# Patient Record
Sex: Male | Born: 1970 | Race: Black or African American | Hispanic: No | Marital: Single | Smoking: Current every day smoker
Health system: Southern US, Community
[De-identification: ages and names within clinical notes are randomized; demographics above are authoritative.]

## PROBLEM LIST (undated history)

## (undated) DIAGNOSIS — I10 Essential (primary) hypertension: Secondary | ICD-10-CM

## (undated) HISTORY — PX: ORTHOPEDIC SURGERY: SHX850

---

## 2001-01-12 ENCOUNTER — Emergency Department (HOSPITAL_COMMUNITY): Admission: EM | Admit: 2001-01-12 | Discharge: 2001-01-12 | Payer: Self-pay | Admitting: *Deleted

## 2001-01-12 ENCOUNTER — Encounter: Payer: Self-pay | Admitting: *Deleted

## 2001-01-19 ENCOUNTER — Encounter: Payer: Self-pay | Admitting: Orthopedic Surgery

## 2001-01-19 ENCOUNTER — Ambulatory Visit (HOSPITAL_COMMUNITY): Admission: RE | Admit: 2001-01-19 | Discharge: 2001-01-19 | Payer: Self-pay | Admitting: Orthopedic Surgery

## 2002-01-31 ENCOUNTER — Inpatient Hospital Stay (HOSPITAL_COMMUNITY): Admission: EM | Admit: 2002-01-31 | Discharge: 2002-02-03 | Payer: Self-pay | Admitting: Emergency Medicine

## 2002-01-31 ENCOUNTER — Encounter: Payer: Self-pay | Admitting: Emergency Medicine

## 2012-02-18 ENCOUNTER — Other Ambulatory Visit (HOSPITAL_COMMUNITY): Payer: Self-pay | Admitting: Internal Medicine

## 2012-02-18 ENCOUNTER — Ambulatory Visit (HOSPITAL_COMMUNITY)
Admission: RE | Admit: 2012-02-18 | Discharge: 2012-02-18 | Disposition: A | Discharge status: Home / Self Care | Payer: Self-pay | Source: Ambulatory Visit | Attending: Internal Medicine | Admitting: Internal Medicine

## 2012-02-18 DIAGNOSIS — M25461 Effusion, right knee: Secondary | ICD-10-CM

## 2012-02-18 DIAGNOSIS — M25469 Effusion, unspecified knee: Secondary | ICD-10-CM | POA: Insufficient documentation

## 2013-07-03 IMAGING — CR DG KNEE COMPLETE 4+V*R*
4 series · 4 of 4 positions shown · non-contrast
Comparison: None.

CLINICAL DATA: Right knee swelling.

RIGHT KNEE - COMPLETE 4+ VIEW

[view not recorded (1 of 4)]
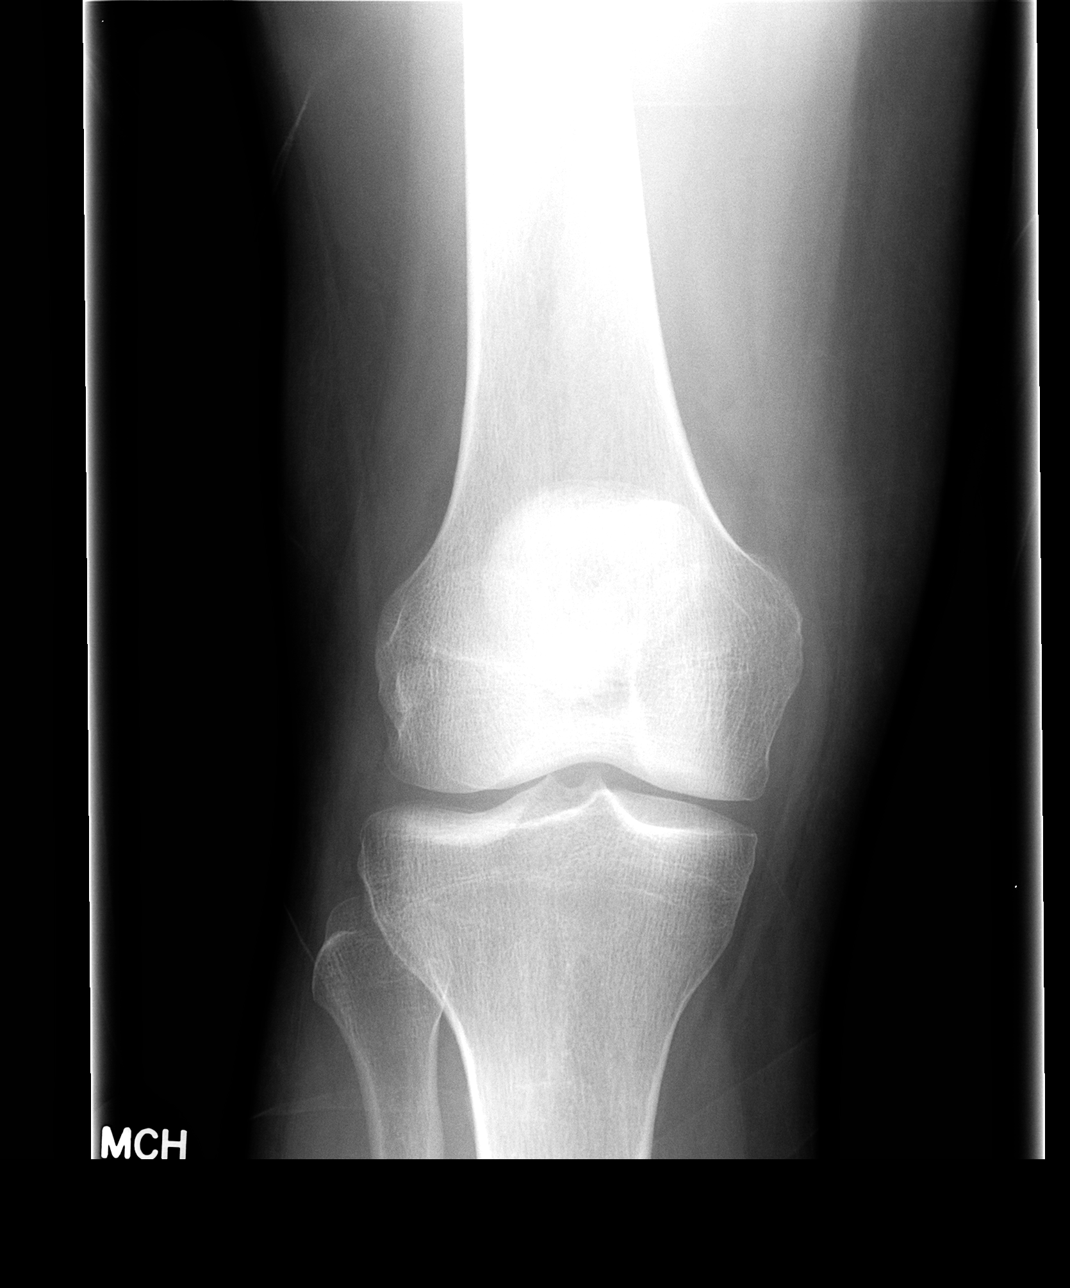

[view not recorded (2 of 4)]
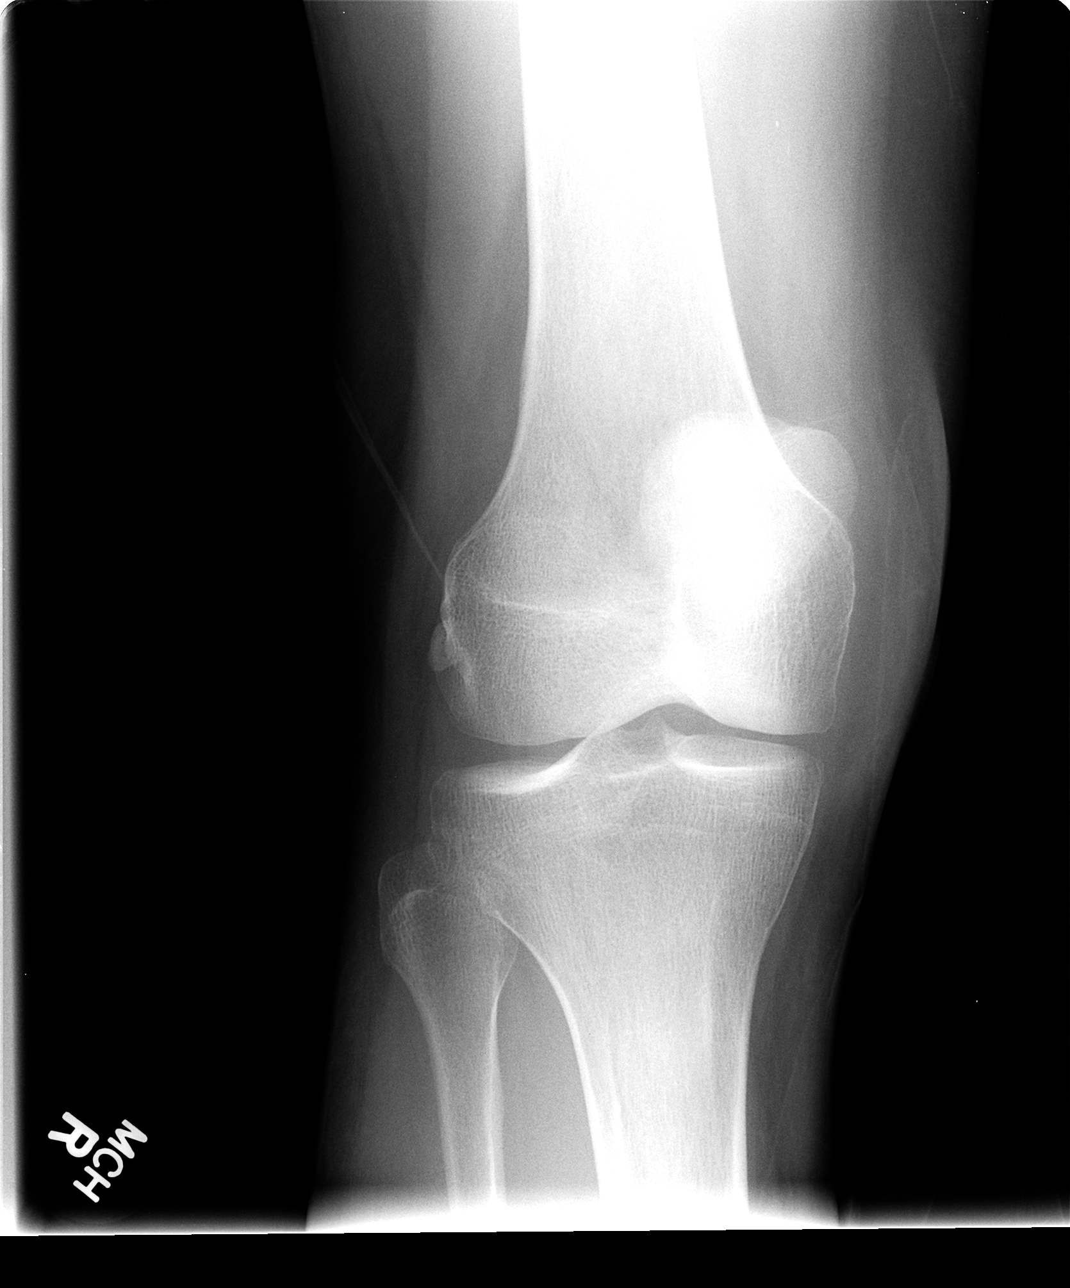

[view not recorded (3 of 4)]
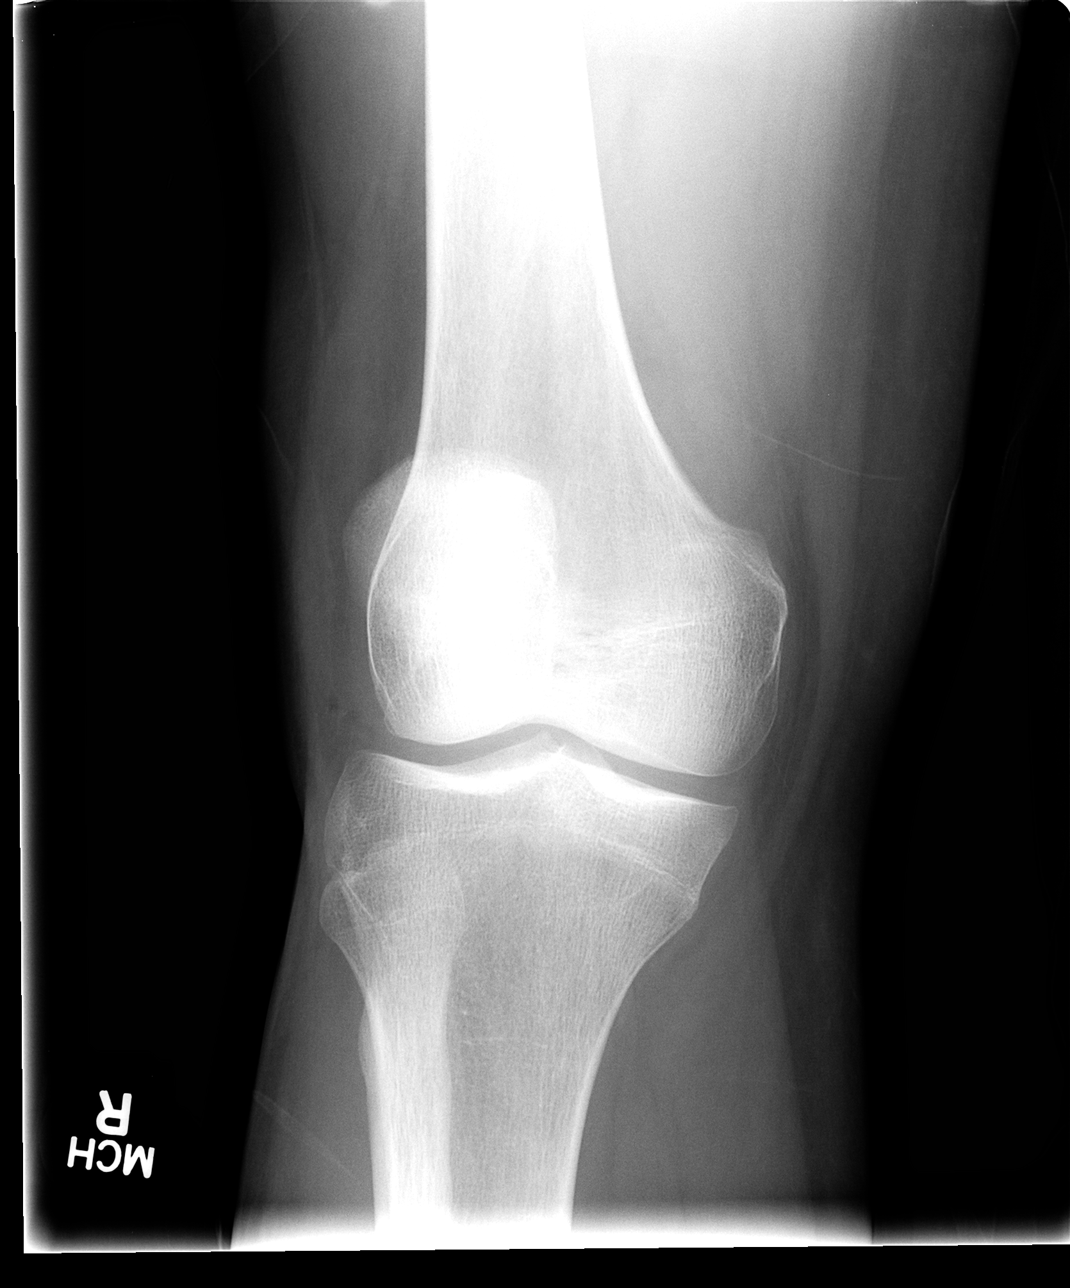

[view not recorded (4 of 4)]
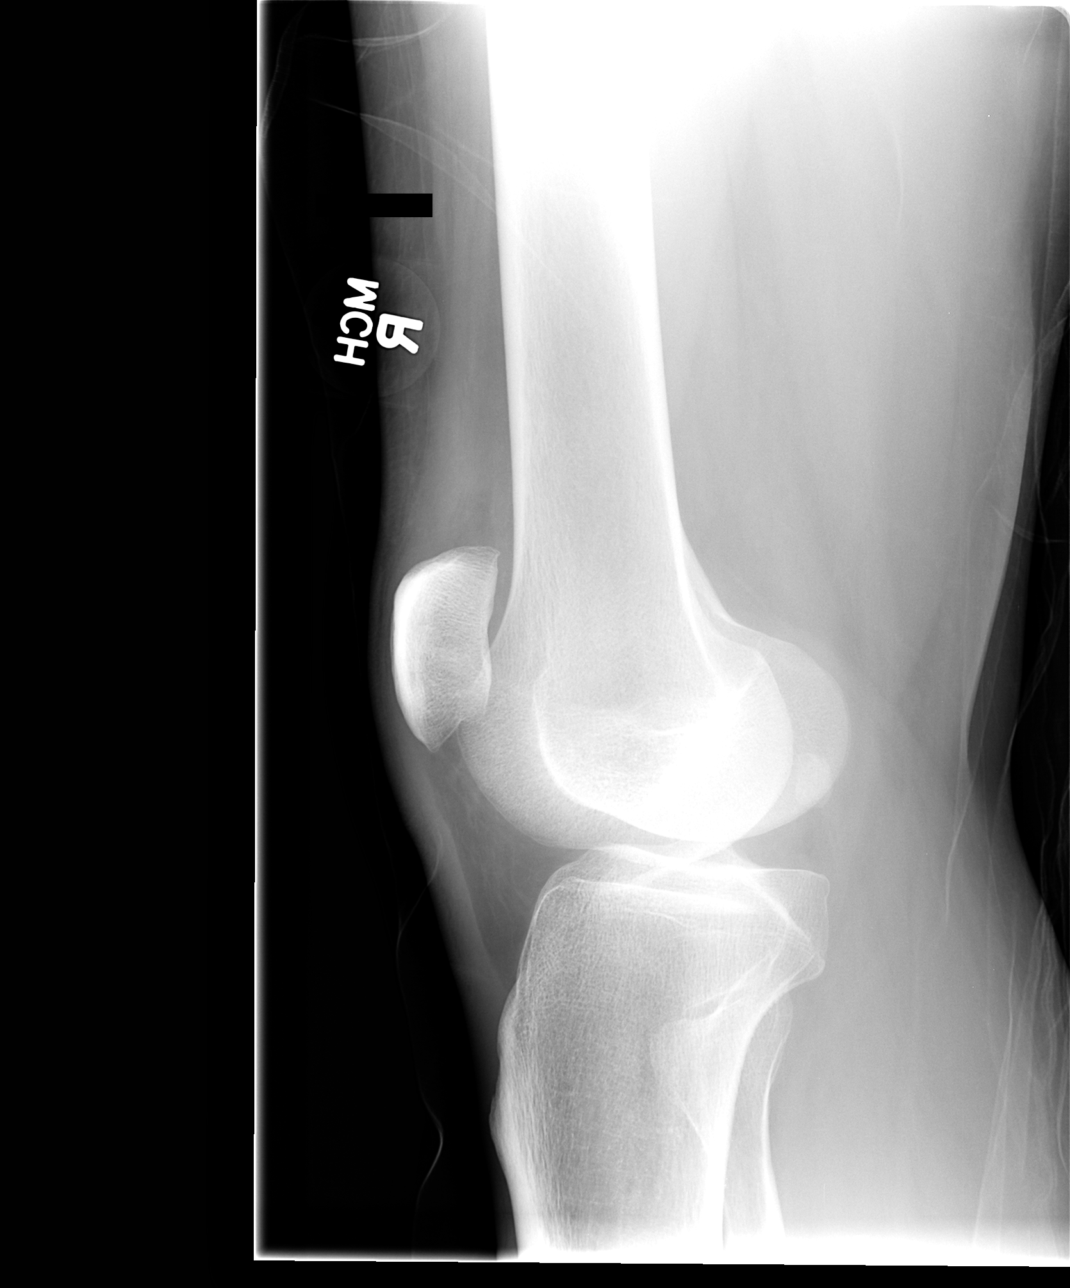

[4 of 4 positions shown; findings below may reference images not displayed]

FINDINGS: Imaged bones, joints and soft tissues appear normal.
IMPRESSION: Negative exam.

## 2022-10-21 ENCOUNTER — Emergency Department (HOSPITAL_COMMUNITY)
Admission: EM | Admit: 2022-10-21 | Discharge: 2022-10-21 | Disposition: A | Discharge status: Home / Self Care | Payer: Commercial Managed Care - HMO | Attending: Emergency Medicine | Admitting: Emergency Medicine

## 2022-10-21 ENCOUNTER — Other Ambulatory Visit: Payer: Self-pay

## 2022-10-21 ENCOUNTER — Emergency Department (HOSPITAL_COMMUNITY): Payer: Commercial Managed Care - HMO

## 2022-10-21 ENCOUNTER — Encounter (HOSPITAL_COMMUNITY): Payer: Self-pay | Admitting: Emergency Medicine

## 2022-10-21 DIAGNOSIS — L539 Erythematous condition, unspecified: Secondary | ICD-10-CM | POA: Diagnosis not present

## 2022-10-21 DIAGNOSIS — R6883 Chills (without fever): Secondary | ICD-10-CM | POA: Insufficient documentation

## 2022-10-21 DIAGNOSIS — D72829 Elevated white blood cell count, unspecified: Secondary | ICD-10-CM | POA: Insufficient documentation

## 2022-10-21 DIAGNOSIS — M79662 Pain in left lower leg: Secondary | ICD-10-CM | POA: Diagnosis present

## 2022-10-21 DIAGNOSIS — L03116 Cellulitis of left lower limb: Secondary | ICD-10-CM | POA: Diagnosis not present

## 2022-10-21 HISTORY — DX: Essential (primary) hypertension: I10

## 2022-10-21 LAB — COMPREHENSIVE METABOLIC PANEL
ALT: 14 U/L (ref 0–44)
AST: 28 U/L (ref 15–41)
Albumin: 3.9 g/dL (ref 3.5–5.0)
Alkaline Phosphatase: 75 U/L (ref 38–126)
Anion gap: 10 (ref 5–15)
BUN: 15 mg/dL (ref 6–20)
CO2: 25 mmol/L (ref 22–32)
Calcium: 8.7 mg/dL — ABNORMAL LOW (ref 8.9–10.3)
Chloride: 102 mmol/L (ref 98–111)
Creatinine, Ser: 1.07 mg/dL (ref 0.61–1.24)
GFR, Estimated: >60 mL/min (ref >60)
Glucose, Bld: 98 mg/dL (ref 70–99)
Potassium: 3.9 mmol/L (ref 3.5–5.1)
Sodium: 137 mmol/L (ref 135–145)
Total Bilirubin: 0.6 mg/dL (ref 0.3–1.2)
Total Protein: 7.5 g/dL (ref 6.5–8.1)

## 2022-10-21 LAB — CBC WITH DIFFERENTIAL/PLATELET
Abs Immature Granulocytes: 0.11 10*3/uL — ABNORMAL HIGH (ref 0.00–0.07)
Basophils Absolute: 0 10*3/uL (ref 0.0–0.1)
Basophils Relative: 0 %
Eosinophils Absolute: 0.1 10*3/uL (ref 0.0–0.5)
Eosinophils Relative: 0 %
HCT: 43.2 % (ref 39.0–52.0)
Hemoglobin: 13.7 g/dL (ref 13.0–17.0)
Immature Granulocytes: 1 %
Lymphocytes Relative: 9 %
Lymphs Abs: 2 10*3/uL (ref 0.7–4.0)
MCH: 26.9 pg (ref 26.0–34.0)
MCHC: 31.7 g/dL (ref 30.0–36.0)
MCV: 84.7 fL (ref 80.0–100.0)
Monocytes Absolute: 1.1 10*3/uL — ABNORMAL HIGH (ref 0.1–1.0)
Monocytes Relative: 5 %
Neutro Abs: 18.9 10*3/uL — ABNORMAL HIGH (ref 1.7–7.7)
Neutrophils Relative %: 85 %
Platelets: 223 10*3/uL (ref 150–400)
RBC: 5.1 MIL/uL (ref 4.22–5.81)
RDW: 13.2 % (ref 11.5–15.5)
WBC: 22.2 10*3/uL — ABNORMAL HIGH (ref 4.0–10.5)
nRBC: 0 % (ref 0.0–0.2)

## 2022-10-21 MED ORDER — IBUPROFEN 600 MG PO TABS: 600.0000 mg | ORAL_TABLET | Freq: Four times a day (QID) | ORAL | 0 refills | Status: DC | PRN

## 2022-10-21 MED ORDER — OXYCODONE-ACETAMINOPHEN 5-325 MG PO TABS
1.0000 | ORAL_TABLET | Freq: Once | ORAL | Status: AC
  Administered 2022-10-21: 1 via ORAL
  Filled 2022-10-21: qty 1

## 2022-10-21 MED ORDER — DEXTROSE 5 % IV SOLN
1500.0000 mg | Freq: Once | INTRAVENOUS | Status: AC
  Administered 2022-10-21: 1500 mg via INTRAVENOUS
  Filled 2022-10-21: qty 75

## 2022-10-21 NOTE — ED Provider Notes (Signed)
[facility] EMERGENCY DEPARTMENT Provider Note   CSN: [id number] Arrival date & time: 10/21/22  1357     History  Chief Complaint  Patient presents with   Leg Pain    Jeffery Tran is a 52 y.o. male.   Leg Pain Associated symptoms: no fever        Jeffery Tran is a 52 y.o. male with past medical history of type tension who presents to the Emergency Department complaining of gradual onset of left lower leg pain, redness, and swelling.  Symptoms began 2 days ago.  Initially, he noticed some nausea and couple episodes of vomiting.  He also endorses having some loose stools over the last 2 days.  Nonbloody or black in color.  He also noticed some circumferential redness to the left lower leg and pain that is constant but worse with weightbearing.  Feels that his leg is swollen and has a burning sensation.  He denies any itching, some chills at home but unsure of fever.  No chest pain, cough, shortness of breath or numbness of the extremity  Denies known injury or possible insect bites of the lower leg.  No history of DVTs.  Home Medications Prior to Admission medications   Not on File      Allergies    Patient has no known allergies.    Review of Systems   Review of Systems  Constitutional:  Negative for chills and fever.  Respiratory:  Negative for cough and shortness of breath.   Cardiovascular:  Negative for chest pain.  Gastrointestinal:  Positive for diarrhea, nausea and vomiting. Negative for abdominal pain.  Genitourinary:  Negative for dysuria.  Skin:  Positive for color change. Negative for wound.  Neurological:  Negative for dizziness, weakness and headaches.    Physical Exam Updated Vital Signs BP (!) 156/90   Pulse (!) 51   Temp 98.1 F (36.7 C)   Resp 18   Ht 5\' 11"  (1.803 m)   Wt 68 kg   SpO2 100%   BMI 20.92 kg/m  Physical Exam Vitals and nursing note reviewed.  Constitutional:      General: He is not in acute distress.    Appearance:  Normal appearance. He is not ill-appearing or toxic-appearing.  HENT:     Mouth/Throat:     Mouth: Mucous membranes are moist.     Pharynx: Oropharynx is clear. No posterior oropharyngeal erythema.  Cardiovascular:     Rate and Rhythm: Normal rate and regular rhythm.     Pulses: Normal pulses.  Pulmonary:     Effort: Pulmonary effort is normal. No respiratory distress.  Abdominal:     General: There is no distension.     Palpations: Abdomen is soft.     Tenderness: There is no abdominal tenderness.  Musculoskeletal:        General: Swelling and tenderness present. No signs of injury.     Right lower leg: No edema.     Left lower leg: Edema present.  Skin:    General: Skin is warm.     Capillary Refill: Capillary refill takes less than 2 seconds.     Findings: Erythema present.     Comments: Excessive warmth with circumferential redness of the left lower leg that extends from ankle to knee.  There is 1 area of lymphangitis to the lateral aspect of the lower leg.  Several old appearing scars of the lower leg as well.  Neurological:     General: No  focal deficit present.     Mental Status: He is alert.     Motor: No weakness.     ED Results / Procedures / Treatments   Labs (all labs ordered are listed, but only abnormal results are displayed) Labs Reviewed  CBC WITH DIFFERENTIAL/PLATELET - Abnormal; Notable for the following components:      Result Value   WBC 22.2 (*)    Neutro Abs 18.9 (*)    Monocytes Absolute 1.1 (*)    Abs Immature Granulocytes 0.11 (*)    All other components within normal limits  COMPREHENSIVE METABOLIC PANEL - Abnormal; Notable for the following components:   Calcium 8.7 (*)    All other components within normal limits    EKG None  Radiology No results found.  Procedures Procedures    Medications Ordered in ED Medications - No data to display  ED Course/ Medical Decision Making/ A&P                             Medical Decision  Making Patient here for evaluation of pain redness and swelling of the left lower leg began 2 days ago symptoms has also been associated with some loose stools and intermittent vomiting.  Endorses chills at home but no known fever.  No recent sick contacts and no known insect bites or injuries to lower extremities.  On my exam, patient well-appearing nontoxic.  Vital signs reassuring, he is afebrile  He does have circumferential erythema with excessive warmth of the left lower leg.  Tenderness with palpation.  Dorsalis pedis pulses palpable bilaterally and his cap refill is good.  Clinically, suspect this is cellulitis although source is unclear at this time.  He does have some scabbed areas/scars to his lower leg with 1 or 2 that appear to have been deroofed.  No history of diabetes.  Differential would also include DVT. I have low clinical suspicion for osteomyelitis.  He does not appear septic.   Amount and/or Complexity of Data Reviewed Labs: ordered.    Details: Labs show significant leukocytosis with white count of 22,000.  Chemistries unremarkable. Radiology: ordered.    Details: Venous imaging of the extremity without evidence of DVT Discussion of management or test interpretation with external provider(s): Patient with likely cellulitis of the left lower extremity, well-appearing has significant leukocytosis but otherwise does not meet SIRS criteria.  Given Dalvance. On recheck, patient resting comfortably.  Remains afebrile.  Neurovascularly intact.  Agrees to symptomatic treatment and close follow-up in 1 to 2 days.  Patient does not currently have PCP so recommended to return here in 2 days for recheck.  He is agreeable to this plan.  Will return sooner if symptoms worsen  Risk Prescription drug management.           Final Clinical Impression(s) / ED Diagnoses Final diagnoses:  Cellulitis of left lower extremity    Rx / DC Orders ED Discharge Orders     None          Kem Parkinson, PA-C 10/23/22 1434    Godfrey Pick, MD 10/29/22 [phone]

## 2022-10-21 NOTE — Progress Notes (Signed)
Pharmacy Antibiotic Note  KINNIE KAUPP is a 52 y.o. male admitted in the ED on 10/21/2022 with cellulitis.  Pharmacy has been consulted for dalbavancin dosing.  Patient with worsening redness, pain, and swelling of left lower leg. [place] of leg was negative for a DVT. No renal fxn history, but creatinine from today is 1.07, eCrCl ~77 ml/min. WBC elevated at 22.2  Plan: Dalbavancin 1500 mg IV x 1  No further abx needed after this dose, pending clinical improvement   Height: 5\' 11"  (180.3 cm) Weight: 68 kg (150 lb) IBW/kg (Calculated) : 75.3  Temp (24hrs), Avg:98.1 F (36.7 C), Min:98.1 F (36.7 C), Max:98.1 F (36.7 C)  Recent Labs  Lab 10/21/22 1652  WBC 22.2*  CREATININE 1.07    Estimated Creatinine Clearance: 77.7 mL/min (by C-G formula based on SCr of 1.07 mg/dL).    No Known Allergies   Thank you for allowing pharmacy to be a part of this patient's care.  Eddie Candle, PharmD, BCPS, BCOP Clinical Pharmacist 10/21/2022 5:55 PM

## 2022-10-21 NOTE — ED Triage Notes (Signed)
Pt c/o lle pain and redness x 2 days.

## 2022-10-21 NOTE — Discharge Instructions (Signed)
Elevate your leg when possible.  Avoid excessive standing or walking.  Please return here in 2 days for recheck.

## 2022-11-04 ENCOUNTER — Encounter: Payer: Self-pay | Admitting: Internal Medicine

## 2022-11-04 ENCOUNTER — Ambulatory Visit (INDEPENDENT_AMBULATORY_CARE_PROVIDER_SITE_OTHER): Payer: Commercial Managed Care - HMO | Admitting: Internal Medicine

## 2022-11-04 VITALS — BP 140/82 | HR 64 | Ht 71.0 in | Wt 160.0 lb

## 2022-11-04 DIAGNOSIS — Z1211 Encounter for screening for malignant neoplasm of colon: Secondary | ICD-10-CM | POA: Diagnosis not present

## 2022-11-04 DIAGNOSIS — I10 Essential (primary) hypertension: Secondary | ICD-10-CM

## 2022-11-04 DIAGNOSIS — Z0001 Encounter for general adult medical examination with abnormal findings: Secondary | ICD-10-CM

## 2022-11-04 DIAGNOSIS — L309 Dermatitis, unspecified: Secondary | ICD-10-CM

## 2022-11-04 MED ORDER — AMLODIPINE BESYLATE 5 MG PO TABS: 5.0000 mg | ORAL_TABLET | Freq: Every day | ORAL | 1 refills | Status: AC

## 2022-11-04 MED ORDER — TRIAMCINOLONE ACETONIDE 0.1 % EX CREA: 1.0000 | TOPICAL_CREAM | Freq: Two times a day (BID) | CUTANEOUS | 0 refills | Status: DC

## 2022-11-04 NOTE — Progress Notes (Signed)
      HPI:Jeffery Tran is a 52 y.o. male with history of HTN not currently on treatment who presents to establish care. For the details of today's visit, please refer to the assessment and plan.  Past Medical History:  Diagnosis Date   Hypertension     Past Surgical History:  Procedure Laterality Date   ORTHOPEDIC SURGERY Right    ARM    History reviewed. No pertinent family history.  Social History   Tobacco Use   Smoking status: Every Day    Packs/day: 0.50    Types: Cigarettes   Smokeless tobacco: Never  Substance Use Topics   Alcohol use: Yes    Comment: occasionally   Drug use: Yes    Types: Marijuana     Physical Exam: Vitals:   11/04/22 0958 11/04/22 1004  BP: (!) 162/107 (!) 140/82  Pulse: 64   SpO2: 98%   Weight: 160 lb (72.6 kg)   Height: 5\' 11"  (1.803 m)      Physical Exam Constitutional:      Appearance: He is well-developed and well-groomed.  Eyes:     General: No scleral icterus.    Conjunctiva/sclera: Conjunctivae normal.  Cardiovascular:     Rate and Rhythm: Normal rate and regular rhythm.     Heart sounds: No murmur heard.    No friction rub. No gallop.  Pulmonary:     Effort: Pulmonary effort is normal.     Breath sounds: No wheezing, rhonchi or rales.  Musculoskeletal:     Right lower leg: No edema.     Left lower leg: No edema.  Skin:    Findings: Rash (dry rough raised rash , pruritic, covers most of lower back, not well demarcated) present.     Comments: Left leg cellulitis has resolved, dry skin       Assessment & Plan:   Eczema Patient has atopic dermatis over lower back. - Apply triamcinolone 0.1% twice daily for 5 days - Given information on moisturizer and how to prevent eczema flares  Essential hypertension BP 162/107, 140/82 on recheck. History of HTN and was on amlodipine.  - Start amlodipine 5 mg daily - Check BP at home before next visit and bring in 7 readings   Encounter for general adult  medical examination with abnormal findings Patient is here to establish care. Will check baseline labs as below. One time screening for HIV and HCV.   - Lipid panel - Hemoglobin A1C - CMP14+EGFR - CBC with Differential/Platelet - VITAMIN D 25 Hydroxy (Vit-D Deficiency, Fractures) - TSH - HCV Ab w Reflex to Quant PCR - HIV Antibody (routine testing w rflx)       Lorene Dy, MD

## 2022-11-04 NOTE — Assessment & Plan Note (Addendum)
BP 162/107, 140/82 on recheck. History of HTN and was on amlodipine.  - Start amlodipine 5 mg daily - Check BP at home before next visit and bring in 7 readings

## 2022-11-04 NOTE — Assessment & Plan Note (Signed)
No family history of colon cancer. Discussed risk and benefits of screening. He would like to proceed with Cologuard

## 2022-11-04 NOTE — Assessment & Plan Note (Signed)
Patient is here to establish care. Will check baseline labs as below. One time screening for HIV and HCV.   - Lipid panel - Hemoglobin A1C - CMP14+EGFR - CBC with Differential/Platelet - VITAMIN D 25 Hydroxy (Vit-D Deficiency, Fractures) - TSH - HCV Ab w Reflex to Quant PCR - HIV Antibody (routine testing w rflx)

## 2022-11-04 NOTE — Assessment & Plan Note (Signed)
Patient has atopic dermatis over lower back. - Apply triamcinolone 0.1% twice daily for 5 days - Given information on moisturizer and how to prevent eczema flares

## 2022-11-04 NOTE — Patient Instructions (Addendum)
Thank you for trusting me with your care. To recap, today we discussed the following:   Hypertension -Start amlodipine 5 mg daily -See attached information on the DASH diet. -Regular physical activity is one of the most important things you can do for your health. If you're ready to get the immediate benefits of better sleep, reduced anxiety, and lower blood pressure, here are ways to get started. Look for ways to reduce time sitting and increase time moving. For example, make it a tradition to walk before or after dinner. I recommend at least 150 minutes of moderate-intensity physical activity a week for adults. You might split that into 30 minutes, 5 days a week.  Eczema -Apply cream twice daily for 5 days -See attached information on moisturizer and how to prevent eczema flares

## 2022-11-11 LAB — CBC WITH DIFFERENTIAL/PLATELET
Basophils Absolute: 0 10*3/uL (ref 0.0–0.2)
Basos: 1 %
EOS (ABSOLUTE): 0.3 10*3/uL (ref 0.0–0.4)
Eos: 6 %
Hematocrit: 37.2 % — ABNORMAL LOW (ref 37.5–51.0)
Hemoglobin: 12.5 g/dL — ABNORMAL LOW (ref 13.0–17.7)
Immature Grans (Abs): 0 10*3/uL (ref 0.0–0.1)
Immature Granulocytes: 0 %
Lymphocytes Absolute: 2.2 10*3/uL (ref 0.7–3.1)
Lymphs: 42 %
MCH: 27 pg (ref 26.6–33.0)
MCHC: 33.6 g/dL (ref 31.5–35.7)
MCV: 80 fL (ref 79–97)
Monocytes Absolute: 0.3 10*3/uL (ref 0.1–0.9)
Monocytes: 6 %
Neutrophils Absolute: 2.4 10*3/uL (ref 1.4–7.0)
Neutrophils: 45 %
Platelets: 401 10*3/uL (ref 150–450)
RBC: 4.63 x10E6/uL (ref 4.14–5.80)
RDW: 12.6 % (ref 11.6–15.4)
WBC: 5.3 10*3/uL (ref 3.4–10.8)

## 2022-11-11 LAB — CMP14+EGFR
ALT: 7 IU/L (ref 0–44)
AST: 11 IU/L (ref 0–40)
Albumin/Globulin Ratio: 1.4 (ref 1.2–2.2)
Albumin: 3.9 g/dL (ref 3.8–4.9)
Alkaline Phosphatase: 77 IU/L (ref 44–121)
BUN/Creatinine Ratio: 9 (ref 9–20)
BUN: 8 mg/dL (ref 6–24)
Bilirubin Total: 0.3 mg/dL (ref 0.0–1.2)
CO2: 21 mmol/L (ref 20–29)
Calcium: 9 mg/dL (ref 8.7–10.2)
Chloride: 107 mmol/L — ABNORMAL HIGH (ref 96–106)
Creatinine, Ser: 0.87 mg/dL (ref 0.76–1.27)
Globulin, Total: 2.7 g/dL (ref 1.5–4.5)
Glucose: 90 mg/dL (ref 70–99)
Potassium: 3.9 mmol/L (ref 3.5–5.2)
Sodium: 144 mmol/L (ref 134–144)
Total Protein: 6.6 g/dL (ref 6.0–8.5)
eGFR: 104 mL/min/{1.73_m2} (ref >59)

## 2022-11-11 LAB — LIPID PANEL
Chol/HDL Ratio: 4.2 ratio (ref 0.0–5.0)
Cholesterol, Total: 220 mg/dL — ABNORMAL HIGH (ref 100–199)
HDL: 52 mg/dL (ref >39)
LDL Chol Calc (NIH): 154 mg/dL — ABNORMAL HIGH (ref 0–99)
Triglycerides: 78 mg/dL (ref 0–149)
VLDL Cholesterol Cal: 14 mg/dL (ref 5–40)

## 2022-11-11 LAB — HEMOGLOBIN A1C
Est. average glucose Bld gHb Est-mCnc: 114 mg/dL
Hgb A1c MFr Bld: 5.6 % (ref 4.8–5.6)

## 2022-11-11 LAB — HIV ANTIBODY (ROUTINE TESTING W REFLEX): HIV Screen 4th Generation wRfx: NONREACTIVE

## 2022-11-11 LAB — VITAMIN D 25 HYDROXY (VIT D DEFICIENCY, FRACTURES): Vit D, 25-Hydroxy: 10.9 ng/mL — ABNORMAL LOW (ref 30.0–100.0)

## 2022-11-11 LAB — TSH: TSH: 2.06 u[IU]/mL (ref 0.450–4.500)

## 2022-11-11 LAB — HCV AB W REFLEX TO QUANT PCR: HCV Ab: NONREACTIVE

## 2022-11-11 LAB — HCV INTERPRETATION

## 2022-11-14 ENCOUNTER — Other Ambulatory Visit: Payer: Self-pay | Admitting: Internal Medicine

## 2022-11-14 DIAGNOSIS — E7800 Pure hypercholesterolemia, unspecified: Secondary | ICD-10-CM

## 2022-11-14 DIAGNOSIS — E559 Vitamin D deficiency, unspecified: Secondary | ICD-10-CM

## 2022-11-14 MED ORDER — ATORVASTATIN CALCIUM 20 MG PO TABS: 20.0000 mg | ORAL_TABLET | Freq: Every day | ORAL | 1 refills | Status: AC

## 2022-11-14 MED ORDER — CHOLECALCIFEROL 1.25 MG (50000 UT) PO CAPS: 50000.0000 [IU] | ORAL_CAPSULE | ORAL | 0 refills | Status: AC

## 2022-11-14 MED ORDER — VITAMIN D3 25 MCG (1000 UT) PO CAPS: 1000.0000 [IU] | ORAL_CAPSULE | Freq: Every day | ORAL | 0 refills | Status: AC

## 2022-11-14 NOTE — Progress Notes (Signed)
Vitamin D deficiency - Take 50000 units once a week for 8 weeks , followed by 1000 units daily. Recheck at follow up -  Cholecalciferol 1.25 MG (50000 UT) capsule; Take 1 capsule (50,000 Units total) by mouth once a week.  Dispense: 8 capsule; Refill: 0 - Cholecalciferol (VITAMIN D3) 25 MCG (1000 UT) CAPS; Take 1 capsule (1,000 Units total) by mouth daily.  Dispense: 60 capsule; Refill: 0   Pure hypercholesterolemia The 10-year ASCVD risk score (Arnett DK, et al., 2019) is: 19.1% Start statin, check lipid panel at follow up. - atorvastatin (LIPITOR) 20 MG tablet; Take 1 tablet (20 mg total) by mouth daily.  Dispense: 90 tablet; Refill: 1

## 2022-11-17 ENCOUNTER — Other Ambulatory Visit: Payer: Self-pay

## 2022-11-17 MED ORDER — TRIAMCINOLONE ACETONIDE 0.1 % EX CREA: 1.0000 | TOPICAL_CREAM | Freq: Two times a day (BID) | CUTANEOUS | 0 refills | Status: AC

## 2022-11-26 LAB — COLOGUARD: COLOGUARD: NEGATIVE

## 2023-02-04 ENCOUNTER — Ambulatory Visit: Payer: Commercial Managed Care - HMO | Admitting: Family Medicine

## 2024-09-12 ENCOUNTER — Ambulatory Visit: Payer: Self-pay | Admitting: Family Medicine
